# Patient Record
Sex: Female | Born: 1955 | Race: White | Hispanic: No | Marital: Married | Smoking: Current every day smoker
Health system: Southern US, Community
[De-identification: ages and names within clinical notes are randomized; demographics above are authoritative.]

## PROBLEM LIST (undated history)

## (undated) DIAGNOSIS — M1990 Unspecified osteoarthritis, unspecified site: Secondary | ICD-10-CM

## (undated) DIAGNOSIS — F32A Depression, unspecified: Secondary | ICD-10-CM

## (undated) DIAGNOSIS — I10 Essential (primary) hypertension: Secondary | ICD-10-CM

## (undated) DIAGNOSIS — F329 Major depressive disorder, single episode, unspecified: Secondary | ICD-10-CM

## (undated) DIAGNOSIS — R112 Nausea with vomiting, unspecified: Secondary | ICD-10-CM

## (undated) DIAGNOSIS — E119 Type 2 diabetes mellitus without complications: Secondary | ICD-10-CM

## (undated) DIAGNOSIS — G4730 Sleep apnea, unspecified: Secondary | ICD-10-CM

## (undated) DIAGNOSIS — F419 Anxiety disorder, unspecified: Secondary | ICD-10-CM

## (undated) DIAGNOSIS — Z9889 Other specified postprocedural states: Secondary | ICD-10-CM

## (undated) HISTORY — PX: APPENDECTOMY: SHX54

## (undated) HISTORY — PX: CERVICAL SPINE SURGERY: SHX589

## (undated) HISTORY — PX: TUBAL LIGATION: SHX77

## (undated) HISTORY — PX: NASAL SINUS SURGERY: SHX719

## (undated) HISTORY — PX: CHOLECYSTECTOMY: SHX55

---

## 1977-04-07 DIAGNOSIS — R112 Nausea with vomiting, unspecified: Secondary | ICD-10-CM

## 1977-04-07 HISTORY — DX: Nausea with vomiting, unspecified: R11.2

## 1977-04-07 HISTORY — PX: OTHER SURGICAL HISTORY: SHX169

## 1997-11-09 ENCOUNTER — Other Ambulatory Visit: Admission: RE | Admit: 1997-11-09 | Discharge: 1997-11-09 | Payer: Self-pay | Admitting: Obstetrics and Gynecology

## 1998-12-06 ENCOUNTER — Other Ambulatory Visit: Admission: RE | Admit: 1998-12-06 | Discharge: 1998-12-06 | Payer: Self-pay | Admitting: Obstetrics and Gynecology

## 1999-06-10 ENCOUNTER — Other Ambulatory Visit: Admission: RE | Admit: 1999-06-10 | Discharge: 1999-06-10 | Payer: Self-pay | Admitting: Obstetrics and Gynecology

## 1999-08-02 ENCOUNTER — Ambulatory Visit (HOSPITAL_COMMUNITY): Admission: RE | Admit: 1999-08-02 | Discharge: 1999-08-02 | Payer: Self-pay | Admitting: Gastroenterology

## 1999-08-02 ENCOUNTER — Encounter (INDEPENDENT_AMBULATORY_CARE_PROVIDER_SITE_OTHER): Payer: Self-pay | Admitting: *Deleted

## 1999-09-26 ENCOUNTER — Encounter: Payer: Self-pay | Admitting: *Deleted

## 1999-09-26 ENCOUNTER — Encounter: Admission: RE | Admit: 1999-09-26 | Discharge: 1999-09-26 | Payer: Self-pay | Admitting: *Deleted

## 1999-12-13 ENCOUNTER — Other Ambulatory Visit: Admission: RE | Admit: 1999-12-13 | Discharge: 1999-12-13 | Payer: Self-pay | Admitting: Obstetrics and Gynecology

## 2000-01-07 ENCOUNTER — Other Ambulatory Visit: Admission: RE | Admit: 2000-01-07 | Discharge: 2000-01-07 | Payer: Self-pay | Admitting: Obstetrics and Gynecology

## 2000-01-07 ENCOUNTER — Encounter (INDEPENDENT_AMBULATORY_CARE_PROVIDER_SITE_OTHER): Payer: Self-pay | Admitting: Specialist

## 2000-04-27 ENCOUNTER — Other Ambulatory Visit: Admission: RE | Admit: 2000-04-27 | Discharge: 2000-04-27 | Payer: Self-pay | Admitting: Obstetrics and Gynecology

## 2000-10-26 ENCOUNTER — Other Ambulatory Visit: Admission: RE | Admit: 2000-10-26 | Discharge: 2000-10-26 | Payer: Self-pay | Admitting: Obstetrics and Gynecology

## 2001-02-04 ENCOUNTER — Encounter: Admission: RE | Admit: 2001-02-04 | Discharge: 2001-03-26 | Payer: Self-pay | Admitting: Orthopedic Surgery

## 2001-07-12 ENCOUNTER — Other Ambulatory Visit: Admission: RE | Admit: 2001-07-12 | Discharge: 2001-07-12 | Payer: Self-pay | Admitting: Obstetrics and Gynecology

## 2001-09-10 ENCOUNTER — Emergency Department (HOSPITAL_COMMUNITY): Admission: EM | Admit: 2001-09-10 | Discharge: 2001-09-10 | Payer: Self-pay | Admitting: Emergency Medicine

## 2001-09-10 ENCOUNTER — Encounter: Payer: Self-pay | Admitting: Emergency Medicine

## 2002-02-01 ENCOUNTER — Encounter: Admission: RE | Admit: 2002-02-01 | Discharge: 2002-02-01 | Payer: Self-pay | Admitting: Gastroenterology

## 2002-02-01 ENCOUNTER — Encounter: Payer: Self-pay | Admitting: Gastroenterology

## 2003-04-05 ENCOUNTER — Observation Stay (HOSPITAL_COMMUNITY): Admission: EM | Admit: 2003-04-05 | Discharge: 2003-04-06 | Payer: Self-pay

## 2003-04-06 ENCOUNTER — Encounter: Payer: Self-pay | Admitting: Cardiology

## 2003-04-27 ENCOUNTER — Encounter: Admission: RE | Admit: 2003-04-27 | Discharge: 2003-04-27 | Payer: Self-pay | Admitting: Internal Medicine

## 2003-08-31 ENCOUNTER — Other Ambulatory Visit: Admission: RE | Admit: 2003-08-31 | Discharge: 2003-08-31 | Payer: Self-pay | Admitting: *Deleted

## 2003-12-25 ENCOUNTER — Encounter: Admission: RE | Admit: 2003-12-25 | Discharge: 2004-02-15 | Payer: Self-pay | Admitting: Orthopedic Surgery

## 2004-08-28 ENCOUNTER — Other Ambulatory Visit: Admission: RE | Admit: 2004-08-28 | Discharge: 2004-08-28 | Payer: Self-pay | Admitting: *Deleted

## 2004-09-17 ENCOUNTER — Encounter: Admission: RE | Admit: 2004-09-17 | Discharge: 2004-09-17 | Payer: Self-pay | Admitting: Internal Medicine

## 2005-11-18 ENCOUNTER — Other Ambulatory Visit: Admission: RE | Admit: 2005-11-18 | Discharge: 2005-11-18 | Payer: Self-pay | Admitting: *Deleted

## 2006-06-12 ENCOUNTER — Ambulatory Visit (HOSPITAL_COMMUNITY): Admission: RE | Admit: 2006-06-12 | Discharge: 2006-06-12 | Payer: Self-pay | Admitting: Neurosurgery

## 2006-11-23 ENCOUNTER — Other Ambulatory Visit: Admission: RE | Admit: 2006-11-23 | Discharge: 2006-11-23 | Payer: Self-pay | Admitting: *Deleted

## 2008-04-04 ENCOUNTER — Ambulatory Visit (HOSPITAL_COMMUNITY): Admission: RE | Admit: 2008-04-04 | Discharge: 2008-04-05 | Payer: Self-pay | Admitting: Neurosurgery

## 2010-05-10 ENCOUNTER — Emergency Department (HOSPITAL_COMMUNITY)
Admission: EM | Admit: 2010-05-10 | Discharge: 2010-05-11 | Disposition: A | Discharge status: Home / Self Care | Payer: BC Managed Care – PPO | Attending: Emergency Medicine | Admitting: Emergency Medicine

## 2010-05-10 ENCOUNTER — Emergency Department (HOSPITAL_COMMUNITY): Payer: BC Managed Care – PPO

## 2010-05-10 DIAGNOSIS — E119 Type 2 diabetes mellitus without complications: Secondary | ICD-10-CM | POA: Insufficient documentation

## 2010-05-10 DIAGNOSIS — R1032 Left lower quadrant pain: Secondary | ICD-10-CM | POA: Insufficient documentation

## 2010-05-10 DIAGNOSIS — R110 Nausea: Secondary | ICD-10-CM | POA: Insufficient documentation

## 2010-05-10 DIAGNOSIS — K5732 Diverticulitis of large intestine without perforation or abscess without bleeding: Secondary | ICD-10-CM | POA: Insufficient documentation

## 2010-05-10 LAB — COMPREHENSIVE METABOLIC PANEL
ALT: 18 U/L (ref 0–35)
AST: 18 U/L (ref 0–37)
Albumin: 4.3 g/dL (ref 3.5–5.2)
Calcium: 9.6 mg/dL (ref 8.4–10.5)
Creatinine, Ser: 0.65 mg/dL (ref 0.4–1.2)
GFR calc Af Amer: >60 mL/min (ref >60)
GFR calc non Af Amer: >60 mL/min (ref >60)
Sodium: 137 mEq/L (ref 135–145)
Total Protein: 8 g/dL (ref 6.0–8.3)

## 2010-05-10 LAB — DIFFERENTIAL
Basophils Absolute: 0.1 10*3/uL (ref 0.0–0.1)
Basophils Relative: 1 % (ref 0–1)
Eosinophils Absolute: 0.2 10*3/uL (ref 0.0–0.7)
Monocytes Absolute: 0.5 10*3/uL (ref 0.1–1.0)
Monocytes Relative: 6 % (ref 3–12)
Neutro Abs: 3.6 10*3/uL (ref 1.7–7.7)

## 2010-05-10 LAB — URINALYSIS, ROUTINE W REFLEX MICROSCOPIC
Bilirubin Urine: NEGATIVE
Hgb urine dipstick: NEGATIVE
Specific Gravity, Urine: 1.008 (ref 1.005–1.030)
Urine Glucose, Fasting: NEGATIVE mg/dL
Urobilinogen, UA: 0.2 mg/dL (ref 0.0–1.0)
pH: 7.5 (ref 5.0–8.0)

## 2010-05-10 LAB — CBC
Hemoglobin: 12.9 g/dL (ref 12.0–15.0)
MCH: 27.5 pg (ref 26.0–34.0)
MCHC: 33.2 g/dL (ref 30.0–36.0)
Platelets: 326 10*3/uL (ref 150–400)
RDW: 13.5 % (ref 11.5–15.5)

## 2010-05-10 MED ORDER — IOHEXOL 300 MG/ML  SOLN
100.0000 mL | Freq: Once | INTRAMUSCULAR | Status: AC | PRN
  Administered 2010-05-10: 100 mL via INTRAVENOUS

## 2010-08-20 NOTE — Op Note (Signed)
NAMETIMOTHEA, BODENHEIMER NO.:  [id number]   MEDICAL RECORD NO.:  [id number]          PATIENT TYPE:  OIB   LOCATION:  3523                         FACILITY:  [facility]   PHYSICIAN:  Coletta Memos, M.D.     DATE OF BIRTH:  1955-12-04   DATE OF PROCEDURE:  04/04/2008  DATE OF DISCHARGE:                               OPERATIVE REPORT   PREOPERATIVE DIAGNOSES:  1. Displaced disk, C7-T1.  2. Cervical radiculopathy.   POSTOPERATIVE DIAGNOSES:  1. Displaced disk, C7-T1.  2. Cervical radiculopathy.   PROCEDURE:  1. Anterior cervical decompression, C7-T1.  2. Arthrodesis with 6-mm structural allograft.  3. Anterior instrumentation 16-mm Vectra plate.  4. Removal of C6-7 plate and screws.   COMPLICATIONS:  None.   SURGEON:  Coletta Memos, MD   ASSISTANT:  Dr. Lovell Sheehan   INDICATIONS:  Jill Gilmore is a 55 year old who presented to the office  after having undergone a C6-7 ACDF 18 months prior with pain in her  upper extremities.  Repeat MRI showed that she had a herniated disk at  C7-T1.  She also had a positive Tinel's sign over the left ulnar groove.  Secondary to the fact that she had cord compression and distortion, I  felt that it is best that she go to the operating room to remove the  pressure on the spinal canal.  She is admitted for that operation.   OPERATIVE NOTE:  Jill Gilmore was brought to the operating room,  intubated, and placed under general anesthetic.  She had her neck placed  in essentially neutral position with 5 pounds of traction applied via  chin strap.  I used the old incision and infiltrated that with 4 mL 0.5%  lidocaine 1:200,000 strength epinephrine.  I opened the skin with a #10  blade and took this down to the platysma.  I dissected through the  platysma and down to the strap muscles.  I was able to reflect those  medially and the sternocleidomastoid laterally.  I went through scar  tissue and was able to identify the old plate.  I then used  a curette to  remove scar tissue around the edges and overlying the screws.  I then  removed 4 screws and the plate without difficulty.  It was a Vectra  plate.   After removing the plate, I then started the diskectomy and  decompression of the spinal canal at C7-T1.   I opened the disk space with a #15 blade.  I then placed distraction  pins one in C7 and one in T1 and distracted the disk space.  I then,  with the use of Kerrison punches, pituitary rongeurs, and curettes  decompressed the spinal canal by removing the disk and posterior  longitudinal ligament.  I fully decompressed both C8 nerve roots.  After  thorough decompression of the nerve roots and canal, I then prepared for  the arthrodesis.  I used a style shaped straight-sided burr to remove  and to even out the bony surfaces at C7 and at T1.  I then placed a 6-mm  graft.  I  removed the traction.  I removed the distraction pins.  I then  with Dr. Lovell Sheehan assistance placed the anterior instrumentation.   I placed a 16-mm plate with two self-drilling 14-mm screws at rescue  side into the old holes from the previous fusion since the pitch and the  width was the same.  I then placed 2 self-tapping screws into T1.  X-ray  showed nothing as it was too low.  But, I knew it was at the right  location as I removed the plate from the previous operation and the  operation was done one level below.  I then irrigated the wound.  I then  closed the wound in layered fashion using Vicryl sutures to  reapproximate the platysma and then subcuticular layer.  I used  Dermabond for sterile dressing.  Jill Gilmore tolerated the procedure  well.           ______________________________  Coletta Memos, M.D.     KC/MEDQ  D:  04/04/2008  T:  04/05/2008  Job:  [id number]

## 2010-08-23 NOTE — H&P (Signed)
NAMEMarland Kitchen  Jill Gilmore, Jill Gilmore                          ACCOUNT NO.:  [id number]   MEDICAL RECORD NO.:  [id number]                   PATIENT TYPE:  INP   LOCATION:  1823                                 FACILITY:  [facility]   PHYSICIAN:  Vania Rea, M.D.              DATE OF BIRTH:  [birth year]   DATE OF ADMISSION:  04/05/2003  DATE OF DISCHARGE:                                HISTORY & PHYSICAL   PRIMARY CARE PHYSICIAN:  Unassigned.   CHIEF COMPLAINT:  Chest pain, worse since today.   HISTORY OF PRESENT ILLNESS:  This is a 55 year old Caucasian lady with a  history of asthma, peptic ulcer disease, tobacco, arthritis, status post  shoulder surgery in 2002, who works out regularly at [employer] three times a  week.  She had been having recurrent episodes of heavy chest pain for the  past one month.  The pain occurs throughout the day and lasted around 30  minutes.  It gives a rest and comes back.  The pain comes on anytime, and  sometimes awakens her from sleep.  It feels like an elephant on her chest.  It is sometimes relieved by lying on the right side.  It radiates to the  back and both shoulders.  The pain may be associated with nausea and  diaphoresis, but the patient is unsure because she has nausea which she  thinks is associated with drinking coffee and taking multiple vitamins.  She  has excessive sweating related to her peri menopausal status.  She has tried  taking Aleve without relief or aggravation.  Overall, the patient feels  there are no relieving or aggravating factors to this pain.  The patient  went top one of her physicians today to attempt to get relief from the pain.  [facility] was called.  She had nitroglycerin in the  doctor's office, but there was no relief.  She got nitroglycerin  sublingually in the emergency room but she does not think that caused the  pain to go away.  The pain seems to come and go of its own accord.   She denies dizziness, syncope  or shortness of breath.  She denies PND,  orthopnea or dyspnea on exertion.   PAST MEDICAL HISTORY:  Asthma, peptic ulcer disease since the 80's, seasonal  allergies, rotator cuff surgery in 2002, diagnosed  with cervical arthritis, 1990.   MEDICATIONS:  1. Prevacid 30 mg daily.  2. Zyrtec for seasonal allergies when necessary.  3. Advair for asthma when necessary.  4. Takes over-the-counter alternative meds.  5. [place] for peri menopausal symptoms.  6. Vitamin E.  7. Calcium tablets.   ALLERGIES:  No known drug allergies.   SOCIAL HISTORY/ FAMILY HISTORY:  She lives with her husband of 18 years.  She smokes one pack per month of tobacco.  Alcohol occasional glass of wine.  She denies illicit drug use.  Her father  died with congestive heart failure  at age 23.  He also had a history of a stroke and he had a history of severe  peripheral vascular disease requiring an amputation, and also hypertension.  Her mother died at age 28 from colon cancer.  She has five siblings, age 88-  76 who are all in good health.  She has a son who is 53 and also in good  health.   REVIEW OF SYSTEMS:  Her last period was 18 months ago.  She has occasional  sinusitis, occasional nausea as described above.  No vomiting, diarrhea or  constipation.  No fever, cough or cold.   She does not know of ever being tested for H pylori, and she does not recall  ever being treated for H pylori.  She has been told that once she has an  ulcer, she will always have an ulcer, and she just needs to keep taking  treatment for it.   PHYSICAL EXAMINATION:  GENERAL:  This is a middle-aged Caucasian lady, lying  in a stretcher.  At the moment, she is in no pain and no distress.  VITAL SIGNS:  Temperature 98, pulse 81, respirations 20, blood pressure  117/73.  She is saturating at 97% on room air.  HEENT:  She is pink.  She is anicteric.  Pupils are equal.  No  lymphadenopathy, no jugular venous pulsation.  CHEST:   Clear to auscultation bilaterally.  She is tender over the  costochondral joints in the left lower-sternal border.  CARDIOVASCULAR:  System is regular rhythm, no murmurs, rubs or gallops.  ABDOMEN:  Soft, obese, nontender, specifically no epigastric tenderness.  EXTREMITIES:  She has 3+ ulcers, no edema.  NEUROLOGIC:  CNS:  She is alert and oriented x3.  MUSCULOSKELETAL:  She has no tenderness over her shoulders or the scapular  area.   LABORATORY DATA:  Hemoglobin is 13.3, hematocrit 39, sodium 130, potassium  3.9, chloride 110, BUN 11, creatinine 0.7.  Glucose 105.  PH shows evidence  of respiratory alkalosis.  PH is 7.47, PCO2 is 26.  Cardiac enzymes are so  far all negative.  MB is less than 1 and 1.2.  Troponin is persistently less  than 0.05.  Myoglobin is 47.8 and 59.6 all normal.   ASSESSMENT:  1. This is a middle-age Caucasian lady with atypical chest pain, probably     related to a combination of her peptic ulcer disease and her cervical and     shoulder problems.  However, she does have a family history of     atherosclerotic disease.  It seems reasonable, since she does not have     primary-care followup, to take steps to rule out myocardial infarction.  2. She describes the pain as being fairly constant.  Although she does have     respiratory alkalosis, she is not really hypoxic.  She is not     tachycardic.  I do not think we need to be aggressive at ruling out     pulmonary embolism.   PLAN:  1. We will admit with aspirin and Nitrol paste, beta-blocker and ACE     inhibitor.  2. We will get a 2-D echocardiogram.  3. We will give her high-dose proton pump inhibitors.  4. We will give her morphine p.r.n. for pain.  5. If she rules out, she can have either and inpatient or an outpatient     stress test.  Vania Rea, M.D.   LC/MEDQ  D:  04/05/2003  T:  04/06/2003  Job:  [id number]

## 2010-08-23 NOTE — Discharge Summary (Signed)
Jill Gilmore, PLATTER                          ACCOUNT NO.:  [id number]   MEDICAL RECORD NO.:  [id number]                   PATIENT TYPE:  INP   LOCATION:  2029                                 FACILITY:  [facility]   PHYSICIAN:  Renato Battles, M.D.                  DATE OF BIRTH:  1955/06/30   DATE OF ADMISSION:  04/05/2003  DATE OF DISCHARGE:  04/06/2003                                 DISCHARGE SUMMARY   DISCHARGE DIAGNOSES:  1. Non-cardiac chest pain most likely secondary to gastroesophageal reflux     or esophageal spasm.  2. History of peptic ulcer disease.  3. Seasonal allergies.  4. Asthma.   DISCHARGE MEDICATIONS:  1. Prevacid 30 mg p.o. b.i.d.  2. Aspirin 81 mg p.o. daily.  3. Advair and Zyrtec -- resume home dose.   CONSULTATIONS:  No consultations.   PROCEDURES:  No procedures.   HISTORY, PHYSICAL AND HOSPITAL COURSE:  The patient is a very pleasant 55-  year-old white female who presented to the emergency department complaining  of anterior chest pain radiating to the back and shoulders bilaterally.   Physical exam was not revealing with stable vital signs.   Initial workup showed negative first set of enzymes, normal electrolytes and  normal hemoglobin and hematocrit.  Subsequent cardiac enzymes and studies  were all within normal limits.  She had transient incomplete right bundle  branch block on one of her EKGs, otherwise, no changes in ST or T waves.   The patient has had no chest pain or shortness of breath during her  hospitalization.  She was ambulating normally.  She had no shortness of  breath.  All three sets of cardiac enzymes were negative.  It is my  conclusion that this patient's chest pain is noncardiac, given the nature of  the pain and the fact that the patient exercises regularly and the negative  findings.  I feel it is most likely secondary to esophageal spasm,  especially that the patient has a history of peptic ulcer disease and she  has been  trying to get off the Prevacid on her own the last few days.   DISCHARGE DIET:  Low fat, low salt.   DISCHARGE ACTIVITY:  Discharge activity as tolerated.   FOLLOWUP:  The patient is to follow up with her primary care physician  within the two weeks from discharge.                                                Renato Battles, M.D.    SA/MEDQ  D:  04/06/2003  T:  04/07/2003  Job:  [id number]

## 2010-08-23 NOTE — Op Note (Signed)
NAMEANEESHA, HOLLORAN                ACCOUNT NO.:  [id number]   MEDICAL RECORD NO.:  [id number]          PATIENT TYPE:  AMB   LOCATION:  SDS                          FACILITY:  [facility]   PHYSICIAN:  Coletta Memos, M.D.     DATE OF BIRTH:  May 08, 1955   DATE OF PROCEDURE:  06/12/2006  DATE OF DISCHARGE:                               OPERATIVE REPORT   PREOPERATIVE DIAGNOSES:  1. Cervical displaced disk right C7.  2. 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  3. Right C7 radiculopathy.   POSTOPERATIVE DIAGNOSES:  1. Cervical displaced disk right C7.  2. 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  3. Right C7 radiculopathy.   PROCEDURE:  1. Anterior cervical decompression C6-7.  2. Arthrodesis C6-7 with 7 mm Synthes bone.  3. Anterior instrumentation vector plate.   COMPLICATIONS:  None.   SURGEON:  Coletta Memos, M.D.   ASSISTANT:  Hilda Lias, M.D.   INDICATIONS:  Ms. Shadowens is a 55 year old woman who presented with pain  in the neck and right upper extremity and right hand.  MRI showed  displaced disk and osteophyte at C6-7 that is centered to the right  side.  I therefore recommended and she agreed to undergo operative  decompression.   OPERATIVE NOTE:  Ms. Socorro was brought to the operating room, intubated  and placed under a general anesthetic without difficulty.  Her neck was  prepped and she was draped in a sterile fashion.  I infiltrated 5 mL of  0.5% lidocaine with 1:200,000 strength epinephrine into the cervical  region, starting from the midline and extending to the medial border of  the left sternocleidomastoid muscle at the cricothyroid cartilage  membrane.  I opened the skin with a #10 blade and I took this down to  the platysma.  I dissected in the plane above the platysma rostrally and  caudally.  I opened the platysma in a horizontal fashion using  Metzenbaum scissors, then dissected rostrally and caudally in the plane  inferior to the platysma.  I was able to dissect through the soft  tissue  to create an avascular corridor to the cervical spine.  The medial strap  muscles were retracted medially.  The carotid artery and  sternocleidomastoid were retracted laterally.  I placed the spinal  needle and confirmed my location at C6-7.  I then reflected the longus  colli muscles bilaterally and prepared for the decompression.  I placed  two distraction pins, one at C6 and the other in C7 and distracted the  disk space.   I opened the disk space with a #15 blade, then using pituitary rongeurs,  a high-speed drill and Kerrison punches, I removed the disk material,  removed the osteophytes and decompressed the spinal canal between C6 and  C7.  I fully decompressed both C7 nerve roots using mainly the drill and  removing osteophytes overlying both nerve roots.  After thorough  decompression.  I placed Gelfoam for hemostasis.  I then turned my  attention to the arthrodesis.   I prepared the endplates for arthrodesis with the high-speed drill.  I  then placed a 7 mm  bone graft into the disk space.  I then removed the  distraction and subsequently the distraction pins.  I then prepared for  instrumentation.   I incised the plate and placed that down, then using the high-speed  drill created a marker hole for my drill.  I then drilled four holes and  placed four screws which are self-tapping, two in C6, two in C7, and  secured the plate.  X-ray showed the plate, plug and screws to be in  good position.  I then irrigated once more.  I then closed the wound in  layered fashion with Dr. Cassandria Santee assistance.  He also assisted with the  instrumentation and arthrodesis.  Vicryl sutures were used to  reapproximate the platysma and subcuticular layers.  Dermabond was used  for sterile dressing.  Ms. Safi tolerated the procedure well.           ______________________________  Coletta Memos, M.D.     KC/MEDQ  D:  06/12/2006  T:  06/12/2006  Job:  [id number]

## 2011-01-10 LAB — GLUCOSE, CAPILLARY: Glucose-Capillary: 170 mg/dL — ABNORMAL HIGH (ref 70–99)

## 2011-01-10 LAB — TYPE AND SCREEN: Antibody Screen: NEGATIVE

## 2011-01-10 LAB — CBC
HCT: 39.9 % (ref 36.0–46.0)
Hemoglobin: 13.5 g/dL (ref 12.0–15.0)
MCHC: 33.7 g/dL (ref 30.0–36.0)
RBC: 4.68 MIL/uL (ref 3.87–5.11)

## 2011-01-10 LAB — BASIC METABOLIC PANEL
CO2: 28 mEq/L (ref 19–32)
Calcium: 9.4 mg/dL (ref 8.4–10.5)
GFR calc Af Amer: >60 mL/min (ref >60)
Potassium: 3.8 mEq/L (ref 3.5–5.1)
Sodium: 138 mEq/L (ref 135–145)

## 2012-08-02 IMAGING — CT CT ABD-PELV W/ CM
1 of 2 series · 14 of 32 positions shown, 19 images · IV contrast (APPLIED)
Comparison: 02/01/2002

CLINICAL DATA: Abdominal pain

CT ABDOMEN AND PELVIS WITH CONTRAST
TECHNIQUE: Multidetector CT imaging of the abdomen and pelvis was
performed following the standard protocol during bolus
administration of intravenous contrast.
Contrast: 100 ml of omni 300

[Series 2: abd/pelv with 5.0 b31f st · axial · 0.63mm/px · z∈[+698,+1108]mm · 14 of 92 slices shown, 19 images]
[im 5/92  soft-tissue]
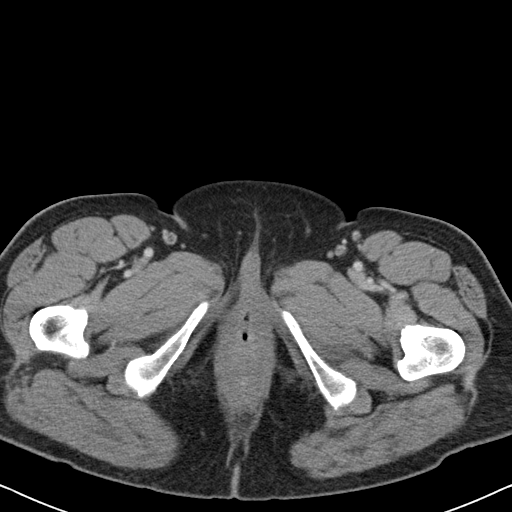
[im 5/92  bone]
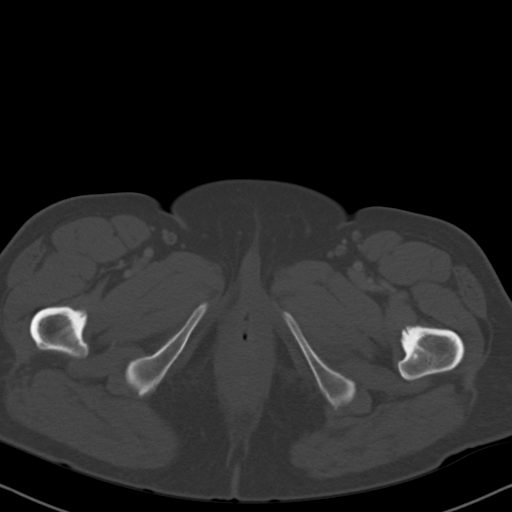
[im 15/92  soft-tissue]
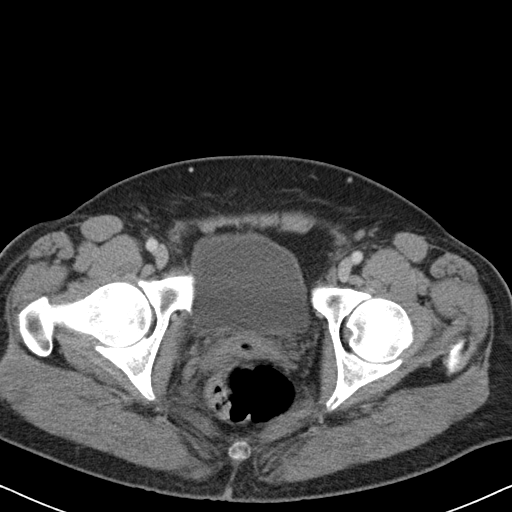
[im 20/92  soft-tissue]
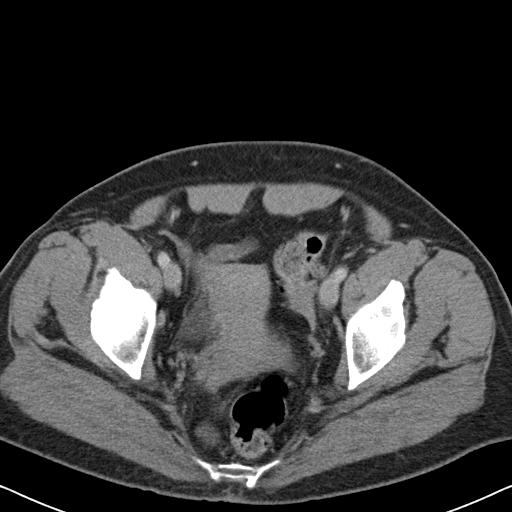
[im 24/92  soft-tissue]
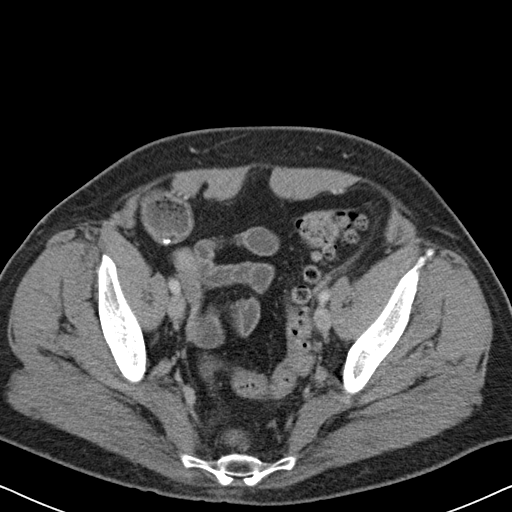
[im 34/92  soft-tissue]
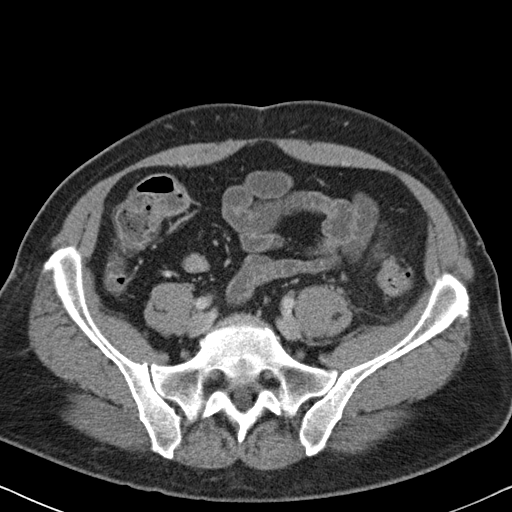
[im 39/92  soft-tissue]
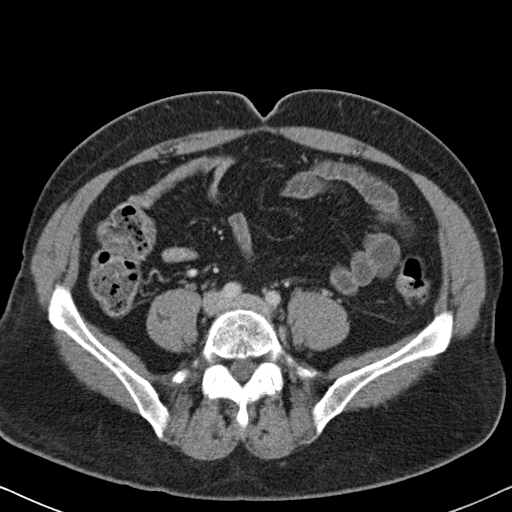
[im 48/92  soft-tissue]
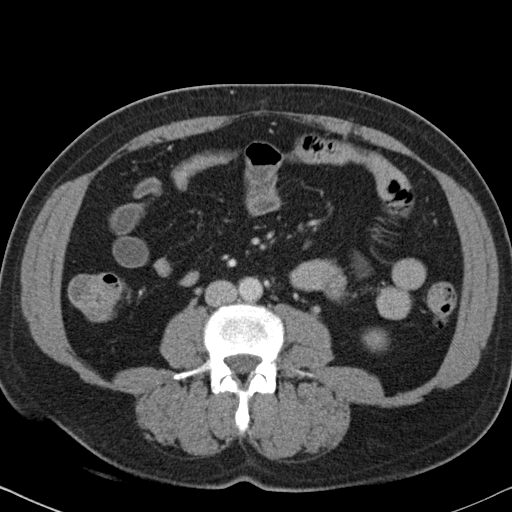
[im 53/92  soft-tissue]
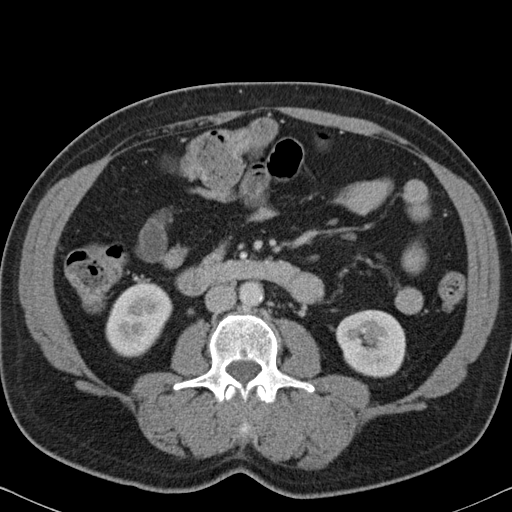
[im 58/92  soft-tissue]
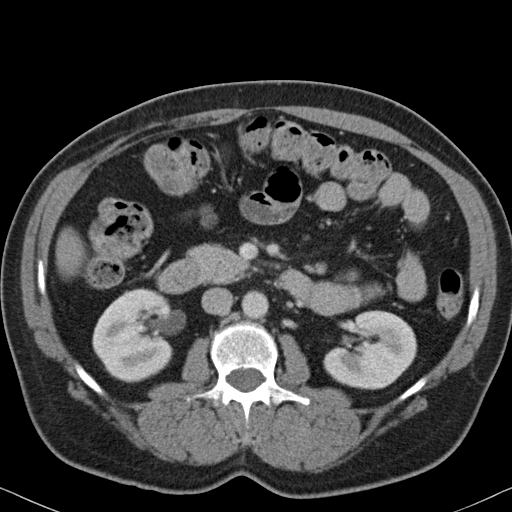
[im 58/92  bone]
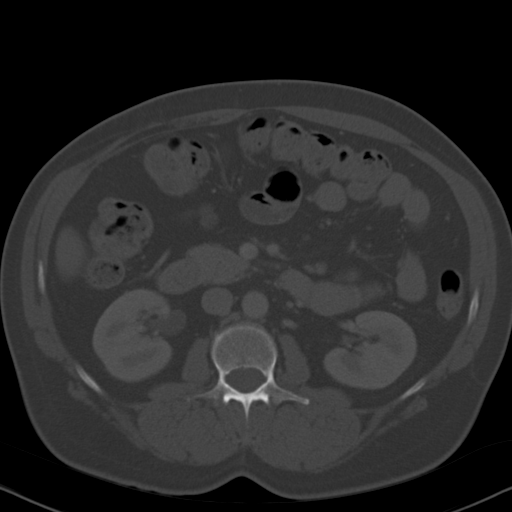
[im 68/92  soft-tissue]
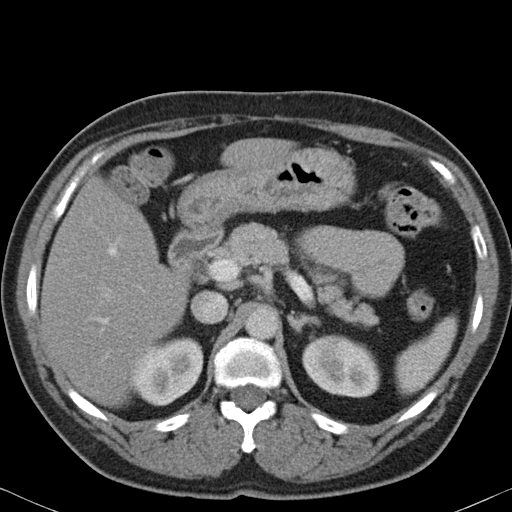
[im 72/92  soft-tissue]
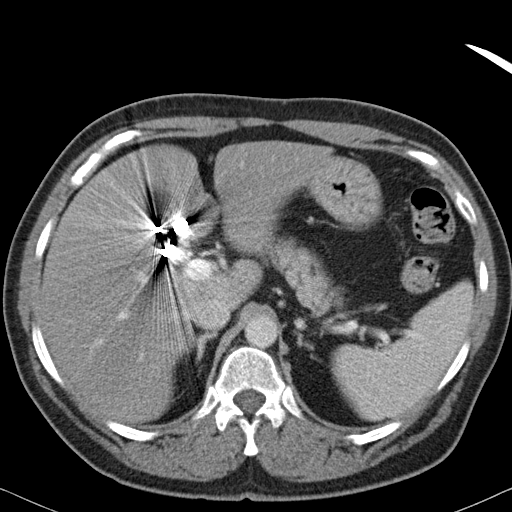
[im 72/92  lung]
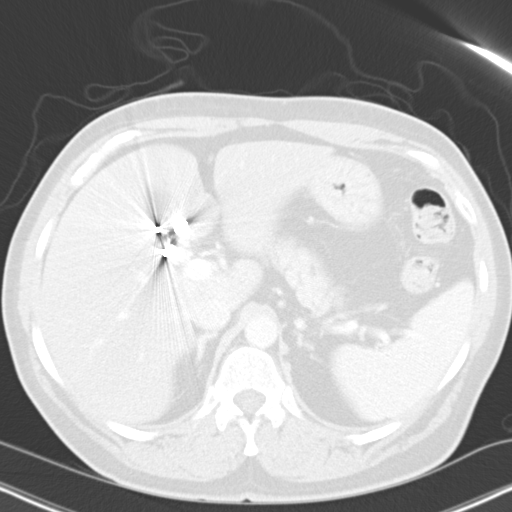
[im 77/92  soft-tissue]
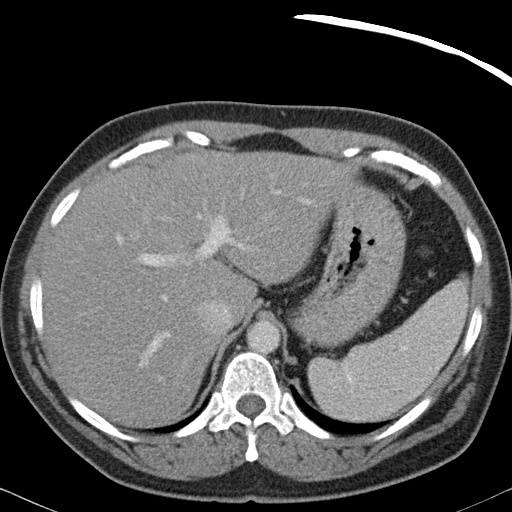
[im 77/92  lung]
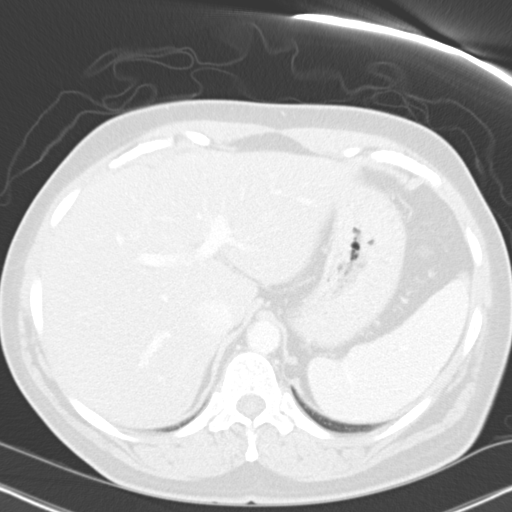
[im 82/92  lung]
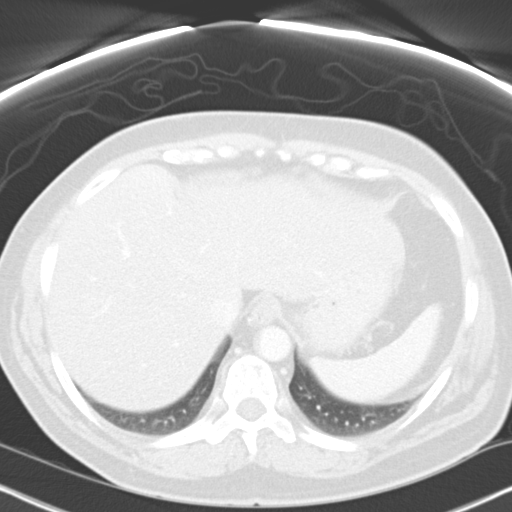
[im 87/92  soft-tissue]
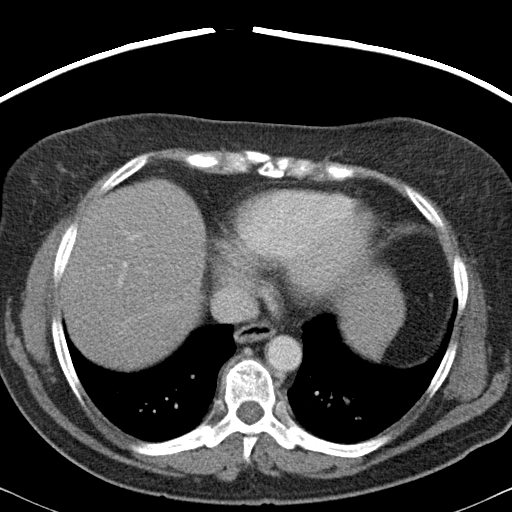
[im 87/92  lung]
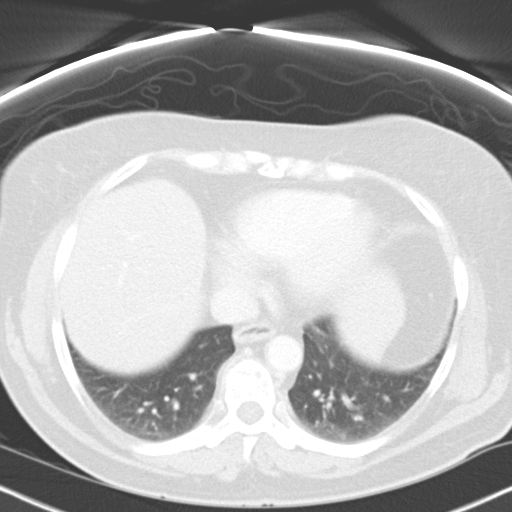

[14 of 32 positions shown; findings below may reference images not displayed]

FINDINGS: There is a 7 mm nodule in the left base.

No pericardial or pleural effusion.

Status post cholecystectomy.

Mild increased caliber of the common duct measuring 0.8 cm, image
23.  No obstructing stone or mass noted.  No intrahepatic biliary
dilatation identified.

Mild diffuse fatty infiltration of the liver.

The pancreas appears normal.

The spleen appears normal.

Both adrenal glands are normal.

The kidneys are negative.

No enlarged upper abdominal lymph nodes.

There are no pelvic or inguinal lymph nodes.

The stomach and small bowel loops appear normal.

There are numerous sigmoid diverticula.  Focal segment of
inflammatory changes surround the junction at the
descending/sigmoid colon.  Inflammation is centered around a
diverticula.  Mild wall thickening is noted.  There is no abscess
or evidence of free intraperitoneal air.

No free fluid is noted within the pelvis.

The urinary bladder is normal.  The uterus and adnexal structures
have a normal appearance

Review of the visualized osseous structures is significant for mild
spondylosis.
IMPRESSION: 1.  Left lower quadrant inflammatory changes likely due to
uncomplicated diverticulitis.
2.  Status post cholecystectomy with mild increased caliber of the
common bile duct.
3.  Nonspecific pulmonary nodule in the left lung base.  New from
02/01/2002.  If the patient is at high risk for bronchogenic
carcinoma, follow-up chest CT at 3-6 months is recommended.  If the
patient is at low risk for bronchogenic carcinoma, follow-up chest
CT at 6-12 months is recommended.  This recommendation follows the
consensus statement: Guidelines for Management of Small Pulmonary
Nodules Detected on CT Scans: A Statement from the [employer]
[URL]

## 2013-11-07 ENCOUNTER — Other Ambulatory Visit: Payer: Self-pay | Admitting: Gastroenterology

## 2013-12-13 ENCOUNTER — Encounter (HOSPITAL_COMMUNITY): Payer: Self-pay | Admitting: Pharmacy Technician

## 2013-12-26 ENCOUNTER — Encounter (HOSPITAL_COMMUNITY): Payer: Self-pay | Admitting: *Deleted

## 2013-12-27 ENCOUNTER — Other Ambulatory Visit: Payer: Self-pay | Admitting: Gastroenterology

## 2014-01-03 ENCOUNTER — Encounter (HOSPITAL_COMMUNITY): Payer: No Typology Code available for payment source | Admitting: Anesthesiology

## 2014-01-03 ENCOUNTER — Ambulatory Visit (HOSPITAL_COMMUNITY): Payer: No Typology Code available for payment source | Admitting: Anesthesiology

## 2014-01-03 ENCOUNTER — Encounter (HOSPITAL_COMMUNITY)
Admission: RE | Disposition: A | Discharge status: Home / Self Care | Payer: Self-pay | Source: Ambulatory Visit | Attending: Gastroenterology

## 2014-01-03 ENCOUNTER — Encounter (HOSPITAL_COMMUNITY): Payer: Self-pay | Admitting: *Deleted

## 2014-01-03 ENCOUNTER — Ambulatory Visit (HOSPITAL_COMMUNITY)
Admission: RE | Admit: 2014-01-03 | Discharge: 2014-01-03 | Disposition: A | Discharge status: Home / Self Care | Payer: No Typology Code available for payment source | Source: Ambulatory Visit | Attending: Gastroenterology | Admitting: Gastroenterology

## 2014-01-03 DIAGNOSIS — J45909 Unspecified asthma, uncomplicated: Secondary | ICD-10-CM | POA: Insufficient documentation

## 2014-01-03 DIAGNOSIS — F411 Generalized anxiety disorder: Secondary | ICD-10-CM | POA: Diagnosis not present

## 2014-01-03 DIAGNOSIS — F3289 Other specified depressive episodes: Secondary | ICD-10-CM | POA: Diagnosis not present

## 2014-01-03 DIAGNOSIS — Z1211 Encounter for screening for malignant neoplasm of colon: Secondary | ICD-10-CM | POA: Insufficient documentation

## 2014-01-03 DIAGNOSIS — G4733 Obstructive sleep apnea (adult) (pediatric): Secondary | ICD-10-CM | POA: Diagnosis not present

## 2014-01-03 DIAGNOSIS — E7800 Pure hypercholesterolemia, unspecified: Secondary | ICD-10-CM | POA: Insufficient documentation

## 2014-01-03 DIAGNOSIS — F329 Major depressive disorder, single episode, unspecified: Secondary | ICD-10-CM | POA: Insufficient documentation

## 2014-01-03 DIAGNOSIS — D126 Benign neoplasm of colon, unspecified: Secondary | ICD-10-CM | POA: Diagnosis not present

## 2014-01-03 DIAGNOSIS — K5730 Diverticulosis of large intestine without perforation or abscess without bleeding: Secondary | ICD-10-CM | POA: Diagnosis not present

## 2014-01-03 DIAGNOSIS — E119 Type 2 diabetes mellitus without complications: Secondary | ICD-10-CM | POA: Insufficient documentation

## 2014-01-03 DIAGNOSIS — J309 Allergic rhinitis, unspecified: Secondary | ICD-10-CM | POA: Insufficient documentation

## 2014-01-03 HISTORY — DX: Nausea with vomiting, unspecified: R11.2

## 2014-01-03 HISTORY — DX: Type 2 diabetes mellitus without complications: E11.9

## 2014-01-03 HISTORY — DX: Unspecified osteoarthritis, unspecified site: M19.90

## 2014-01-03 HISTORY — DX: Other specified postprocedural states: Z98.890

## 2014-01-03 HISTORY — DX: Depression, unspecified: F32.A

## 2014-01-03 HISTORY — DX: Major depressive disorder, single episode, unspecified: F32.9

## 2014-01-03 HISTORY — DX: Anxiety disorder, unspecified: F41.9

## 2014-01-03 HISTORY — PX: COLONOSCOPY WITH PROPOFOL: SHX5780

## 2014-01-03 HISTORY — DX: Sleep apnea, unspecified: G47.30

## 2014-01-03 HISTORY — DX: Essential (primary) hypertension: I10

## 2014-01-03 LAB — GLUCOSE, CAPILLARY: GLUCOSE-CAPILLARY: 116 mg/dL — AB (ref 70–99)

## 2014-01-03 SURGERY — COLONOSCOPY WITH PROPOFOL
Anesthesia: Monitor Anesthesia Care

## 2014-01-03 MED ORDER — MIDAZOLAM HCL 2 MG/2ML IJ SOLN
INTRAMUSCULAR | Status: AC
  Filled 2014-01-03: qty 2

## 2014-01-03 MED ORDER — PROPOFOL 10 MG/ML IV BOLUS
INTRAVENOUS | Status: AC
  Filled 2014-01-03: qty 20

## 2014-01-03 MED ORDER — SODIUM CHLORIDE 0.9 % IV SOLN: INTRAVENOUS | Status: DC

## 2014-01-03 MED ORDER — LIDOCAINE HCL (CARDIAC) 20 MG/ML IV SOLN
INTRAVENOUS | Status: AC
  Filled 2014-01-03: qty 5

## 2014-01-03 MED ORDER — LACTATED RINGERS IV SOLN
INTRAVENOUS | Status: DC
  Administered 2014-01-03: 12:00:00 via INTRAVENOUS

## 2014-01-03 SURGICAL SUPPLY — 22 items

## 2014-01-03 NOTE — H&P (Signed)
  Procedure: Surveillance colonoscopy. Normal surveillance colonoscopy performed on 09/13/2008. Personal history of adenomatous colon polyps removed colonoscopically  History: The patient is a 58 year old female born 1955-10-20. She is scheduled to undergo a surveillance colonoscopy today.  Past medical history: Tubal ligation. Shoulder surgery. Cholecystectomy. Appendectomy. Cervical spine surgery. Type 2 diabetes mellitus. Allergic rhinitis. Asthma. Anxiety with depression. Hypercholesterolemia. Obstructive sleep apnea syndrome.  Allergies: Codeine. Crestor. Lipitor.  Exam: The patient is alert and lying comfortably on the endoscopy stretcher. Lungs are clear to auscultation. Cardiac exam reveals a regular rhythm . Abdomen is soft and nontender to palpation  Plan: Proceed with surveillance colonoscopy

## 2014-01-03 NOTE — Op Note (Signed)
Procedure: Surveillance colonoscopy. Adenomatous colon polyps removed colonoscopically in the past.  Endoscopist: Earle Gell  Premedication: Propofol administered by anesthesia  Procedure: The patient was placed in the left lateral decubitus position. Anal inspection and digital rectal exam were normal. The Pentax pediatric colonoscope was introduced into the rectum and advanced to the cecum. A normal-appearing appendiceal orifice was identified. A normal-appearing ileocecal valve was identified. Colonic preparation for the exam today was good.  Rectum. Normal. Retroflexed view of the distal rectum normal  Sigmoid colon. Extensive colonic diverticulosis. From the distal sigmoid colon, a 7 mm pedunculated polyp was removed with the electrocautery snare  Descending colon. Extensive colonic diverticulosis  Splenic flexure. Normal  Transverse colon. Normal  Hepatic flexure. Normal  Ascending colon. Normal  Cecum and ileocecal valve. Cecal diverticulosis  Assessment: A 7 mm pedunculated polyp was removed from the distal sigmoid colon. Otherwise normal surveillance colonoscopy  Recommendation: Schedule repeat surveillance colonoscopy in 5 years

## 2014-01-03 NOTE — Discharge Instructions (Signed)
Colonoscopy, Care After °These instructions give you information on caring for yourself after your procedure. Your doctor may also give you more specific instructions. Call your doctor if you have any problems or questions after your procedure. °HOME CARE °· Do not drive for 24 hours. °· Do not sign important papers or use machinery for 24 hours. °· You may shower. °· You may go back to your usual activities, but go slower for the first 24 hours. °· Take rest breaks often during the first 24 hours. °· Walk around or use warm packs on your belly (abdomen) if you have belly cramping or gas. °· Drink enough fluids to keep your pee (urine) clear or pale yellow. °· Resume your normal diet. Avoid heavy or fried foods. °· Avoid drinking alcohol for 24 hours or as told by your doctor. °· Only take medicines as told by your doctor. °If a tissue sample (biopsy) was taken during the procedure:  °· Do not take aspirin or blood thinners for 7 days, or as told by your doctor. °· Do not drink alcohol for 7 days, or as told by your doctor. °· Eat soft foods for the first 24 hours. °GET HELP IF: °You still have a small amount of blood in your poop (stool) 2-3 days after the procedure. °GET HELP RIGHT AWAY IF: °· You have more than a small amount of blood in your poop. °· You see clumps of tissue (blood clots) in your poop. °· Your belly is puffy (swollen). °· You feel sick to your stomach (nauseous) or throw up (vomit). °· You have a fever. °· You have belly pain that gets worse and medicine does not help. °MAKE SURE YOU: °· Understand these instructions. °· Will watch your condition. °· Will get help right away if you are not doing well or get worse. °Document Released: 04/26/2010 Document Revised: 03/29/2013 Document Reviewed: 11/29/2012 °[employer]® Patient Information ©2015 [employer]. This information is not intended to replace advice given to you by your health care provider. Make sure you discuss any questions you have with  your health care provider. ° °

## 2014-01-03 NOTE — Anesthesia Preprocedure Evaluation (Signed)
Anesthesia Evaluation  Patient identified by MRN, date of birth, ID band Patient awake    Reviewed: Allergy & Precautions, H&P , NPO status , Patient's Chart, lab work & pertinent test results  History of Anesthesia Complications (+) PONV  Airway Mallampati: II TM Distance: >3 FB Neck ROM: Full    Dental no notable dental hx. (+) Caps   Pulmonary neg pulmonary ROS, sleep apnea and Continuous Positive Airway Pressure Ventilation , Current Smoker,  breath sounds clear to auscultation  Pulmonary exam normal       Cardiovascular hypertension, Pt. on medications negative cardio ROS  Rhythm:Regular Rate:Normal     Neuro/Psych negative neurological ROS  negative psych ROS   GI/Hepatic negative GI ROS, Neg liver ROS,   Endo/Other  negative endocrine ROSdiabetes, Type 2, Oral Hypoglycemic Agents  Renal/GU negative Renal ROS  negative genitourinary   Musculoskeletal negative musculoskeletal ROS (+)   Abdominal   Peds negative pediatric ROS (+)  Hematology negative hematology ROS (+)   Anesthesia Other Findings   Reproductive/Obstetrics negative OB ROS                           Anesthesia Physical Anesthesia Plan  ASA: II  Anesthesia Plan: MAC   Post-op Pain Management:    Induction: Intravenous  Airway Management Planned: Simple Face Mask  Additional Equipment:   Intra-op Plan:   Post-operative Plan:   Informed Consent: I have reviewed the patients History and Physical, chart, labs and discussed the procedure including the risks, benefits and alternatives for the proposed anesthesia with the patient or authorized representative who has indicated his/her understanding and acceptance.   Dental advisory given  Plan Discussed with: CRNA  Anesthesia Plan Comments:         Anesthesia Quick Evaluation

## 2014-01-04 ENCOUNTER — Encounter (HOSPITAL_COMMUNITY): Payer: Self-pay | Admitting: Gastroenterology
